# Patient Record
Sex: Male | Born: 1985 | Race: White | Hispanic: No | Marital: Married | Smoking: Current every day smoker
Health system: Southern US, Community
[De-identification: ages and names within clinical notes are randomized; demographics above are authoritative.]

## PROBLEM LIST (undated history)

## (undated) HISTORY — PX: TOOTH EXTRACTION: SUR596

---
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
## 2008-10-07 ENCOUNTER — Emergency Department (HOSPITAL_COMMUNITY): Admission: EM | Admit: 2008-10-07 | Discharge: 2008-10-07 | Payer: Self-pay | Admitting: Emergency Medicine

## 2009-04-07 ENCOUNTER — Emergency Department (HOSPITAL_COMMUNITY): Admission: EM | Admit: 2009-04-07 | Discharge: 2009-04-08 | Payer: Self-pay | Admitting: Emergency Medicine

## 2013-05-26 ENCOUNTER — Emergency Department (HOSPITAL_COMMUNITY)
Admission: EM | Admit: 2013-05-26 | Discharge: 2013-05-26 | Disposition: A | Discharge status: Home / Self Care | Payer: Medicaid Other | Attending: Emergency Medicine | Admitting: Emergency Medicine

## 2013-05-26 ENCOUNTER — Encounter (HOSPITAL_COMMUNITY): Payer: Self-pay | Admitting: *Deleted

## 2013-05-26 ENCOUNTER — Emergency Department (HOSPITAL_COMMUNITY): Payer: Medicaid Other

## 2013-05-26 DIAGNOSIS — Y9239 Other specified sports and athletic area as the place of occurrence of the external cause: Secondary | ICD-10-CM | POA: Insufficient documentation

## 2013-05-26 DIAGNOSIS — X500XXA Overexertion from strenuous movement or load, initial encounter: Secondary | ICD-10-CM | POA: Insufficient documentation

## 2013-05-26 DIAGNOSIS — Y92838 Other recreation area as the place of occurrence of the external cause: Secondary | ICD-10-CM | POA: Insufficient documentation

## 2013-05-26 DIAGNOSIS — F17200 Nicotine dependence, unspecified, uncomplicated: Secondary | ICD-10-CM | POA: Insufficient documentation

## 2013-05-26 DIAGNOSIS — Y9364 Activity, baseball: Secondary | ICD-10-CM | POA: Insufficient documentation

## 2013-05-26 DIAGNOSIS — S8392XA Sprain of unspecified site of left knee, initial encounter: Secondary | ICD-10-CM

## 2013-05-26 DIAGNOSIS — IMO0002 Reserved for concepts with insufficient information to code with codable children: Secondary | ICD-10-CM | POA: Insufficient documentation

## 2013-05-26 MED ORDER — IBUPROFEN 800 MG PO TABS: 800.0000 mg | ORAL_TABLET | Freq: Three times a day (TID) | ORAL | Status: DC

## 2013-05-26 MED ORDER — TRAMADOL HCL 50 MG PO TABS: 50.0000 mg | ORAL_TABLET | Freq: Four times a day (QID) | ORAL | Status: AC | PRN

## 2013-05-26 NOTE — ED Notes (Signed)
Pain rt knee, after sliding into base 1 week ago, Abrasion present.healing.  No swelling. Pt has cont to play baseball since injury.

## 2013-05-26 NOTE — ED Notes (Signed)
Injured right knee sliding into base playing baseball.  Hurts all the way around knee.

## 2013-05-28 NOTE — ED Provider Notes (Signed)
History     CSN: [id number]  Arrival date & time 05/26/13  1035   First MD Initiated Contact with Patient 05/26/13 1125      Chief Complaint  Patient presents with  . Knee Pain    (Consider location/radiation/quality/duration/timing/severity/associated sxs/prior treatment) Patient is a 27 y.o. male presenting with knee pain. The history is provided by the patient and a significant other.  Knee Pain Location:  Knee Time since incident:  1 week Injury: yes   Mechanism of injury: fall   Mechanism of injury comment:  He believes he twisted his knee when sliding into base while playing baseball one week ago.  He has continued to play baseball, but his pain returns with this activity. Fall:    Fall occurred:  Running Knee location:  R knee Pain details:    Quality:  Aching and throbbing   Radiates to:  Does not radiate   Severity:  Moderate   Onset quality:  Sudden   Timing:  Intermittent   Progression:  Unchanged Chronicity:  New Prior injury to area:  No Relieved by:  Rest Worsened by:  Activity Ineffective treatments:  Rest and NSAIDs Associated symptoms: no back pain, no fever, no muscle weakness, no numbness, no swelling and no tingling     History reviewed. No pertinent past medical history.  Past Surgical History  Procedure Laterality Date  . Tooth extraction      History reviewed. No pertinent family history.  History  Substance Use Topics  . Smoking status: Current Every Day Smoker -- 0.50 packs/day    Types: Cigarettes  . Smokeless tobacco: Never Used  . Alcohol Use: No      Review of Systems  Constitutional: Negative for fever.  Musculoskeletal: Positive for joint swelling and arthralgias. Negative for myalgias and back pain.  Neurological: Negative for weakness and numbness.    Allergies  Review of patient's allergies indicates no known allergies.  Home Medications   Current Outpatient Rx  Name  Route  Sig  Dispense  Refill  . ibuprofen  (ADVIL,MOTRIN) 800 MG tablet   Oral   Take 1 tablet (800 mg total) by mouth 3 (three) times daily.   21 tablet   0   . traMADol (ULTRAM) 50 MG tablet   Oral   Take 1 tablet (50 mg total) by mouth every 6 (six) hours as needed for pain.   15 tablet   0     BP 125/66  Pulse 50  Temp(Src) 97.3 F (36.3 C) (Oral)  Resp 17  Ht 6' (1.829 m)  Wt 165 lb (74.844 kg)  BMI 22.37 kg/m2  SpO2 100%  Physical Exam  Constitutional: He appears well-developed and well-nourished.  HENT:  Head: Atraumatic.  Neck: Normal range of motion.  Cardiovascular:  Pulses equal bilaterally  Musculoskeletal: He exhibits tenderness. He exhibits no edema.       Right knee: He exhibits normal range of motion, no swelling, no effusion, no ecchymosis, no deformity, no erythema, no LCL laxity and no MCL laxity. Tenderness found. Lateral joint line tenderness noted.  Neurological: He is alert. He has normal strength. He displays normal reflexes. No sensory deficit.  Equal strength  Skin: Skin is warm and dry.  Psychiatric: He has a normal mood and affect.    ED Course  Procedures (including critical care time)  Labs Reviewed - No data to display No results found.   1. Knee sprain and strain, left, initial encounter  MDM  Patients labs and/or radiological studies were viewed and considered during the medical decision making and disposition process.  X-ray findings were discussed with patient.  He was encouraged to try to minimize activities that exacerbate his pain and to follow up with orthopedics for further evaluation of a suspected soft tissue injury, not limited to either a ligament strain possible meniscal tear.  He was prescribed ibuprofen and tramadol, encouraged RICE, he has a neoprene knee sleeve which he will continue to wear when necessary.  Referral to orthopedics given.  The patient appears reasonably screened and/or stabilized for discharge and I doubt any other medical condition  or other [facility] requiring further screening, evaluation, or treatment in the ED at this time prior to discharge.        Burgess Amor, PA-C 05/28/13 1553

## 2013-05-31 NOTE — ED Provider Notes (Signed)
Medical screening examination/treatment/procedure(s) were performed by non-physician practitioner and as supervising physician I was immediately available for consultation/collaboration. Oluwaferanmi Wain, MD, FACEP   Anique Beckley L Brave Dack, MD 05/31/13 2121 

## 2015-04-15 ENCOUNTER — Encounter (HOSPITAL_COMMUNITY): Payer: Self-pay | Admitting: *Deleted

## 2015-04-15 ENCOUNTER — Emergency Department (HOSPITAL_COMMUNITY): Payer: BLUE CROSS/BLUE SHIELD

## 2015-04-15 ENCOUNTER — Emergency Department (HOSPITAL_COMMUNITY)
Admission: EM | Admit: 2015-04-15 | Discharge: 2015-04-15 | Disposition: A | Discharge status: Home / Self Care | Payer: BLUE CROSS/BLUE SHIELD | Attending: Emergency Medicine | Admitting: Emergency Medicine

## 2015-04-15 DIAGNOSIS — Z720 Tobacco use: Secondary | ICD-10-CM | POA: Diagnosis not present

## 2015-04-15 DIAGNOSIS — S161XXA Strain of muscle, fascia and tendon at neck level, initial encounter: Secondary | ICD-10-CM | POA: Diagnosis not present

## 2015-04-15 DIAGNOSIS — Y9389 Activity, other specified: Secondary | ICD-10-CM | POA: Diagnosis not present

## 2015-04-15 DIAGNOSIS — Y92410 Unspecified street and highway as the place of occurrence of the external cause: Secondary | ICD-10-CM | POA: Insufficient documentation

## 2015-04-15 DIAGNOSIS — Y998 Other external cause status: Secondary | ICD-10-CM | POA: Insufficient documentation

## 2015-04-15 DIAGNOSIS — S199XXA Unspecified injury of neck, initial encounter: Secondary | ICD-10-CM | POA: Diagnosis present

## 2015-04-15 MED ORDER — METHOCARBAMOL 500 MG PO TABS: 1000.0000 mg | ORAL_TABLET | Freq: Three times a day (TID) | ORAL | Status: AC | PRN

## 2015-04-15 MED ORDER — IBUPROFEN 800 MG PO TABS
800.0000 mg | ORAL_TABLET | Freq: Once | ORAL | Status: AC
  Administered 2015-04-15: 800 mg via ORAL
  Filled 2015-04-15: qty 1

## 2015-04-15 MED ORDER — IBUPROFEN 800 MG PO TABS: 800.0000 mg | ORAL_TABLET | Freq: Three times a day (TID) | ORAL | Status: DC

## 2015-04-15 MED ORDER — METHOCARBAMOL 500 MG PO TABS
1000.0000 mg | ORAL_TABLET | Freq: Once | ORAL | Status: AC
  Administered 2015-04-15: 1000 mg via ORAL
  Filled 2015-04-15: qty 2
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
## 2015-04-15 NOTE — ED Notes (Signed)
Gave patient graham crackers and peanut butter as requested and approved by Burgess AmorJulie Idol, PA.
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
## 2015-04-15 NOTE — Discharge Instructions (Signed)
Muscle Strain °A muscle strain is an injury that occurs when a muscle is stretched beyond its normal length. Usually a small number of muscle fibers are torn when this happens. Muscle strain is rated in degrees. First-degree strains have the least amount of muscle fiber tearing and pain. Second-degree and third-degree strains have increasingly more tearing and pain.  °Usually, recovery from muscle strain takes 1-2 weeks. Complete healing takes 5-6 weeks.  °CAUSES  °Muscle strain happens when a sudden, violent force placed on a muscle stretches it too far. This may occur with lifting, sports, or a fall.  °RISK FACTORS °Muscle strain is especially common in athletes.  °SIGNS AND SYMPTOMS °At the site of the muscle strain, there may be: °· Pain. °· Bruising. °· Swelling. °· Difficulty using the muscle due to pain or lack of normal function. °DIAGNOSIS  °Your health care provider will perform a physical exam and ask about your medical history. °TREATMENT  °Often, the best treatment for a muscle strain is resting, icing, and applying cold compresses to the injured area.   °HOME CARE INSTRUCTIONS  °· Use the PRICE method of treatment to promote muscle healing during the first 2-3 days after your injury. The PRICE method involves: °· Protecting the muscle from being injured again. °· Restricting your activity and resting the injured body part. °· Icing your injury. To do this, put ice in a plastic bag. Place a towel between your skin and the bag. Then, apply the ice and leave it on from 15-20 minutes each hour. After the third day, switch to moist heat packs. °· Apply compression to the injured area with a splint or elastic bandage. Be careful not to wrap it too tightly. This may interfere with blood circulation or increase swelling. °· Elevate the injured body part above the level of your heart as often as you can. °· Only take over-the-counter or prescription medicines for pain, discomfort, or fever as directed by your  health care provider. °· Warming up prior to exercise helps to prevent future muscle strains. °SEEK MEDICAL CARE IF:  °· You have increasing pain or swelling in the injured area. °· You have numbness, tingling, or a significant loss of strength in the injured area. °MAKE SURE YOU:  °· Understand these instructions. °· Will watch your condition. °· Will get help right away if you are not doing well or get worse. °Document Released: 12/03/2005 Document Revised: 09/23/2013 Document Reviewed: 07/02/2013 °ExitCare® Patient Information ©2015 [employer]. This information is not intended to replace advice given to you by your health care provider. Make sure you discuss any questions you have with your health care provider. ° °Motor Vehicle Collision °It is common to have multiple bruises and sore muscles after a motor vehicle collision (MVC). These tend to feel worse for the first 24 hours. You may have the most stiffness and soreness over the first several hours. You may also feel worse when you wake up the first morning after your collision. After this point, you will usually begin to improve with each day. The speed of improvement often depends on the severity of the collision, the number of injuries, and the location and nature of these injuries. °HOME CARE INSTRUCTIONS °· Put ice on the injured area. °¨ Put ice in a plastic bag. °¨ Place a towel between your skin and the bag. °¨ Leave the ice on for 15-20 minutes, 3-4 times a day, or as directed by your health care provider. °· Drink enough fluids to   keep your urine clear or pale yellow. Do not drink alcohol. °· Take a warm shower or bath once or twice a day. This will increase blood flow to sore muscles. °· You may return to activities as directed by your caregiver. Be careful when lifting, as this may aggravate neck or back pain. °· Only take over-the-counter or prescription medicines for pain, discomfort, or fever as directed by your caregiver. Do not use  aspirin. This may increase bruising and bleeding. °SEEK IMMEDIATE MEDICAL CARE IF: °· You have numbness, tingling, or weakness in the arms or legs. °· You develop severe headaches not relieved with medicine. °· You have severe neck pain, especially tenderness in the middle of the back of your neck. °· You have changes in bowel or bladder control. °· There is increasing pain in any area of the body. °· You have shortness of breath, light-headedness, dizziness, or fainting. °· You have chest pain. °· You feel sick to your stomach (nauseous), throw up (vomit), or sweat. °· You have increasing abdominal discomfort. °· There is blood in your urine, stool, or vomit. °· You have pain in your shoulder (shoulder strap areas). °· You feel your symptoms are getting worse. °MAKE SURE YOU: °· Understand these instructions. °· Will watch your condition. °· Will get help right away if you are not doing well or get worse. °Document Released: 12/03/2005 Document Revised: 04/19/2014 Document Reviewed: 05/02/2011 °ExitCare® Patient Information ©2015 [employer]. This information is not intended to replace advice given to you by your health care provider. Make sure you discuss any questions you have with your health care provider. ° °

## 2015-08-10 ENCOUNTER — Ambulatory Visit (INDEPENDENT_AMBULATORY_CARE_PROVIDER_SITE_OTHER): Payer: 59 | Admitting: Family Medicine

## 2015-08-10 ENCOUNTER — Encounter: Payer: Self-pay | Admitting: Family Medicine

## 2015-08-10 VITALS — BP 130/76 | Wt 178.4 lb

## 2015-08-10 DIAGNOSIS — M25561 Pain in right knee: Secondary | ICD-10-CM | POA: Diagnosis not present

## 2015-08-10 MED ORDER — NABUMETONE 750 MG PO TABS: 750.0000 mg | ORAL_TABLET | Freq: Two times a day (BID) | ORAL | Status: AC

## 2015-08-10 NOTE — Progress Notes (Signed)
   Subjective:    Patient ID: Logan Nguyen, male    DOB: [birth year], 29 y.o.   MRN: [id number]  Knee Pain  The incident occurred more than 1 week ago. The injury mechanism was a twisting injury (Patient states that he stepped in a whole at work and twisted knee 8 year ago). The pain is present in the right knee. He reports no foreign bodies present. Exacerbated by: Bending. He has tried NSAIDs (Ibuprofen) for the symptoms. The treatment provided mild relief.    Patient states that knee pain originally started 8 years ago after stepping in a hole and twisting knee at work. Patient states that pain has worsened over the past few months causing him to wake up in the middle of the night. Patient states no other concerns this visit.  At times right knee was suddenly giveaway. Will swell at times. Painful with any type of motion. Unable to participate in sports anymore. Starting to affect his ability to do the work he needs to do. Pain severe times. Now starting to wake him up in the middle of the night  ibu 200 three or four as needed   Does a lot of heavy lifting, drives a fork truck, has to pick things up, hurts with eithr motion +++++++++++++++++++++++++++++++++++++ Review of Systems No headache no chest pain no back pain no abdominal pain    Objective:   Physical Exam  Alert vital stable HEENT normal. Lungs clear heart regular rate and rhythm. Right leg negative straight leg raise right knee positive crepitations positive medial joint line tenderness some swelling evident      Assessment & Plan:  Impression chronic right knee difficulties, started with a sudden event. Progressive in nature now affecting patient considerably both at work and home. Already is had x-ray. Plan MRI affected knee. An inflammatory medicine prescribed. Likely will need or so referral once we know results. WSL

## 2015-08-23 ENCOUNTER — Other Ambulatory Visit (HOSPITAL_COMMUNITY): Payer: BLUE CROSS/BLUE SHIELD

## 2015-08-24 ENCOUNTER — Ambulatory Visit (HOSPITAL_COMMUNITY): Payer: 59

## 2015-09-01 ENCOUNTER — Ambulatory Visit (HOSPITAL_COMMUNITY): Admission: RE | Admit: 2015-09-01 | Payer: 59 | Source: Ambulatory Visit

## 2015-12-31 ENCOUNTER — Emergency Department (HOSPITAL_COMMUNITY)
Admission: EM | Admit: 2015-12-31 | Discharge: 2015-12-31 | Disposition: A | Discharge status: Home / Self Care | Payer: Self-pay | Attending: Emergency Medicine | Admitting: Emergency Medicine

## 2015-12-31 ENCOUNTER — Encounter (HOSPITAL_COMMUNITY): Payer: Self-pay | Admitting: *Deleted

## 2015-12-31 DIAGNOSIS — Y9389 Activity, other specified: Secondary | ICD-10-CM | POA: Insufficient documentation

## 2015-12-31 DIAGNOSIS — Y9289 Other specified places as the place of occurrence of the external cause: Secondary | ICD-10-CM | POA: Insufficient documentation

## 2015-12-31 DIAGNOSIS — S91134A Puncture wound without foreign body of right lesser toe(s) without damage to nail, initial encounter: Secondary | ICD-10-CM | POA: Insufficient documentation

## 2015-12-31 DIAGNOSIS — Y990 Civilian activity done for income or pay: Secondary | ICD-10-CM | POA: Insufficient documentation

## 2015-12-31 DIAGNOSIS — Z791 Long term (current) use of non-steroidal anti-inflammatories (NSAID): Secondary | ICD-10-CM | POA: Insufficient documentation

## 2015-12-31 DIAGNOSIS — W450XXA Nail entering through skin, initial encounter: Secondary | ICD-10-CM | POA: Insufficient documentation

## 2015-12-31 DIAGNOSIS — Z23 Encounter for immunization: Secondary | ICD-10-CM | POA: Insufficient documentation

## 2015-12-31 DIAGNOSIS — S91331A Puncture wound without foreign body, right foot, initial encounter: Secondary | ICD-10-CM

## 2015-12-31 DIAGNOSIS — F17210 Nicotine dependence, cigarettes, uncomplicated: Secondary | ICD-10-CM | POA: Insufficient documentation

## 2015-12-31 MED ORDER — CIPROFLOXACIN HCL 500 MG PO TABS: 500.0000 mg | ORAL_TABLET | Freq: Two times a day (BID) | ORAL | Status: AC

## 2015-12-31 MED ORDER — TETANUS-DIPHTH-ACELL PERTUSSIS 5-2.5-18.5 LF-MCG/0.5 IM SUSP
0.5000 mL | Freq: Once | INTRAMUSCULAR | Status: AC
  Administered 2015-12-31: 0.5 mL via INTRAMUSCULAR
  Filled 2015-12-31: qty 0.5

## 2015-12-31 MED ORDER — CIPROFLOXACIN HCL 250 MG PO TABS
500.0000 mg | ORAL_TABLET | Freq: Once | ORAL | Status: AC
  Administered 2015-12-31: 500 mg via ORAL
  Filled 2015-12-31: qty 2

## 2015-12-31 MED ORDER — IBUPROFEN 800 MG PO TABS
800.0000 mg | ORAL_TABLET | Freq: Once | ORAL | Status: AC
  Administered 2015-12-31: 800 mg via ORAL
  Filled 2015-12-31: qty 1

## 2015-12-31 MED ORDER — IBUPROFEN 800 MG PO TABS
ORAL_TABLET | ORAL | Status: AC
  Filled 2015-12-31: qty 1

## 2015-12-31 MED ORDER — MELOXICAM 15 MG PO TABS: 15.0000 mg | ORAL_TABLET | Freq: Every day | ORAL | Status: AC

## 2015-12-31 NOTE — ED Notes (Signed)
Pt states he stepped on a nail Friday at work. Pt c/o pain to right foot.

## 2015-12-31 NOTE — Discharge Instructions (Signed)
Please soak your foot in warm salt water for 10-15 minutes daily until the wound is healed. His use Cipro 2 times daily, Mobic daily with food. Please see your primary physician if any red streaks, pus like drainage, or signs of advancing infection. Puncture Wound A puncture wound is an injury that is caused by a sharp, thin object that goes through (penetrates) your skin. Usually, a puncture wound does not leave a large opening in your skin, so it may not bleed a lot. However, when you get a puncture wound, dirt or other materials (foreign bodies) can be forced into your wound and break off inside. This increases the chance of infection, such as tetanus. CAUSES Puncture wounds are caused by any sharp, thin object that goes through your skin, such as:  Animal teeth, as with an animal bite.  Sharp, pointed objects, such as nails, splinters of glass, fishhooks, and needles. SYMPTOMS Symptoms of a puncture wound include:  Pain.  Bleeding.  Swelling.  Bruising.  Fluid leaking from the wound.  Numbness, tingling, or loss of function. DIAGNOSIS This condition is diagnosed with a medical history and physical exam. Your wound will be checked to see if it contains any foreign bodies. You may also have X-rays or other imaging tests. TREATMENT Treatment for a puncture wound depends on how serious the wound is. It also depends on whether the wound contains any foreign bodies. Treatment for all types of puncture wounds usually starts with:  Controlling the bleeding.  Washing out the wound with a germ-free (sterile) salt-water solution.  Checking the wound for foreign bodies. Treatment may also include:  Having the wound opened surgically to remove a foreign object.  Closing the wound with stitches (sutures) if it continues to bleed.  Covering the wound with antibiotic ointments and a bandage (dressing).  Receiving a tetanus shot.  Receiving a rabies vaccine. HOME CARE  INSTRUCTIONS Medicines  Take or apply over-the-counter and prescription medicines only as told by your health care provider.  If you were prescribed an antibiotic, take or apply it as told by your health care provider. Do not stop using the antibiotic even if your condition improves. Wound Care  There are many ways to close and cover a wound. For example, a wound can be covered with sutures, skin glue, or adhesive strips. Follow instructions from your health care provider about:  How to take care of your wound.  When and how you should change your dressing.  When you should remove your dressing.  Removing whatever was used to close your wound.  Keep the dressing dry as told by your health care provider. Do not take baths, swim, use a hot tub, or do anything that would put your wound underwater until your health care provider approves.  Clean the wound as told by your health care provider.  Do not scratch or pick at the wound.  Check your wound every day for signs of infection. Watch for:  Redness, swelling, or pain.  Fluid, blood, or pus. General Instructions  Raise (elevate) the injured area above the level of your heart while you are sitting or lying down.  If your puncture wound is in your foot, ask your health care provider if you need to avoid putting weight on your foot and for how long.  Keep all follow-up visits as told by your health care provider. This is important. SEEK MEDICAL CARE IF:  You received a tetanus shot and you have swelling, severe pain, redness, or  bleeding at the injection site.  You have a fever.  Your sutures come out.  You notice a bad smell coming from your wound or your dressing.  You notice something coming out of your wound, such as wood or glass.  Your pain is not controlled with medicine.  You have increased redness, swelling, or pain at the site of your wound.  You have fluid, blood, or pus coming from your wound.  You notice  a change in the color of your skin near your wound.  You need to change the dressing frequently due to fluid, blood, or pus draining from your wound.  You develop a new rash.  You develop numbness around your wound. SEEK IMMEDIATE MEDICAL CARE IF:  You develop severe swelling around your wound.  Your pain suddenly increases and is severe.  You develop painful skin lumps.  You have a red streak going away from your wound.  The wound is on your hand or foot and you cannot properly move a finger or toe.  The wound is on your hand or foot and you notice that your fingers or toes look pale or bluish.   This information is not intended to replace advice given to you by your health care provider. Make sure you discuss any questions you have with your health care provider.   Document Released: 09/12/2005 Document Revised: 08/24/2015 Document Reviewed: 01/26/2015 [employer] Interactive Patient Education [employer]2016 [employer]

## 2015-12-31 NOTE — ED Provider Notes (Signed)
CSN: [id number]     Arrival date & time 12/31/15  2217 History  By signing my name below, I, Placido Sou, attest that this documentation has been prepared under the direction and in the presence of Ivery Quale, PA-C. Electronically Signed: Placido Sou, ED Scribe. 12/31/2015. 11:07 PM.   Chief Complaint  Patient presents with  . Foot Injury   Patient is a 30 y.o. male presenting with foot injury. The history is provided by the patient. No language interpreter was used.  Foot Injury Location:  Foot Time since incident:  1 day Injury: no   Foot location:  Sole of R foot  HPI Comments: YSABEL COWGILL is a 30 y.o. male who presents to the Emergency Department complaining of constant, mild, right foot sole pain after stepping on a nail yesterday morning while wearing socks and rubber bottomed boots. He notes his pain worsens when standing or ambulating and associated, mild pain to his right fourth toe. He denies any known medical conditions or being on any regular medications. Pt is unsure of the timing of his last TDAP booster. He denies any other associated symptoms at this time.    History reviewed. No pertinent past medical history. Past Surgical History  Procedure Laterality Date  . Tooth extraction     History reviewed. No pertinent family history. Social History  Substance Use Topics  . Smoking status: Current Every Day Smoker -- 0.50 packs/day    Types: Cigarettes  . Smokeless tobacco: Never Used  . Alcohol Use: No    Review of Systems  Musculoskeletal: Positive for myalgias.  Skin: Positive for wound.  All other systems reviewed and are negative.   Allergies  Review of patient's allergies indicates no known allergies.  Home Medications   Prior to Admission medications   Medication Sig Start Date End Date Taking? Authorizing Provider  ibuprofen (ADVIL,MOTRIN) 800 MG tablet Take 1 tablet (800 mg total) by mouth 3 (three) times daily. 04/15/15   Burgess Amor,  PA-C  methocarbamol (ROBAXIN) 500 MG tablet Take 2 tablets (1,000 mg total) by mouth every 8 (eight) hours as needed for muscle spasms. Patient not taking: Reported on 08/10/2015 04/15/15   Burgess Amor, PA-C  nabumetone (RELAFEN) 750 MG tablet Take 1 tablet (750 mg total) by mouth 2 (two) times daily. With food 08/10/15   Merlyn Albert, MD  traMADol (ULTRAM) 50 MG tablet Take 1 tablet (50 mg total) by mouth every 6 (six) hours as needed for pain. Patient not taking: Reported on 04/15/2015 05/26/13   Burgess Amor, PA-C   BP 132/69 mmHg  Pulse 56  Temp(Src) 98.6 F (37 C) (Oral)  Resp 16  Ht 6' (1.829 m)  Wt 175 lb (79.379 kg)  BMI 23.73 kg/m2  SpO2 99%    Physical Exam  Constitutional: He is oriented to person, place, and time. He appears well-developed and well-nourished.  HENT:  Head: Normocephalic and atraumatic.  Neck: Normal range of motion.  Cardiovascular: Normal rate, regular rhythm and normal heart sounds.  Exam reveals no gallop and no friction rub.   No murmur heard. Pulses:      Dorsalis pedis pulses are 2+ on the right side, and 2+ on the left side.       Posterior tibial pulses are 2+ on the right side, and 2+ on the left side.  Cap refill less than 2 seconds  Pulmonary/Chest: Effort normal and breath sounds normal. No respiratory distress. He has no wheezes. He has no rales.  He exhibits no tenderness.  Abdominal: Soft.  Musculoskeletal: Normal range of motion.  Right foot: No lesions between toes; small puncture wound near the metatarsal head of fourth phalanx; mild TTP at puncture site; no read streaks or drainage; no tendon involvement; no other puncture wounds appreciated  Neurological: He is alert and oriented to person, place, and time.  Skin: Skin is warm and dry. He is not diaphoretic.  Psychiatric: He has a normal mood and affect. His behavior is normal.  Nursing note and vitals reviewed.   ED Course  Procedures  DIAGNOSTIC STUDIES: Oxygen Saturation is 99%  on RA, normal by my interpretation.    COORDINATION OF CARE: 10:58 PM Pt presents today due to pain in his right foot. Discussed next steps with pt including TDAP, Cipro and warm soaks. Pt agreed to the plan.    Labs Review Labs Reviewed - No data to display  Imaging Review No results found.   EKG Interpretation None      MDM Pt was wearing a rubber sole shoe when he stepped on a nail. No neurovascular changes noted. Pt covered with tetanus, and cipro daily. Pt given instruction to return if any signs of advancing infection.   Final diagnoses:  None    *I have reviewed nursing notes, vital signs, and all appropriate lab and imaging results for this patient.**  I personally performed the services described in this documentation, which was scribed in my presence. The recorded information has been reviewed and is accurate.   Ivery QualeHobson Earma Nicolaou, PA-C 01/01/16 1711  Samuel JesterKathleen McManus, DO 01/04/16 [phone]

## 2016-12-26 ENCOUNTER — Encounter (HOSPITAL_COMMUNITY): Payer: Self-pay

## 2016-12-26 ENCOUNTER — Emergency Department (HOSPITAL_COMMUNITY)
Admission: EM | Admit: 2016-12-26 | Discharge: 2016-12-26 | Disposition: A | Discharge status: Home / Self Care | Payer: Self-pay | Attending: Emergency Medicine | Admitting: Emergency Medicine

## 2016-12-26 ENCOUNTER — Emergency Department (HOSPITAL_COMMUNITY): Payer: Self-pay

## 2016-12-26 DIAGNOSIS — X58XXXA Exposure to other specified factors, initial encounter: Secondary | ICD-10-CM | POA: Insufficient documentation

## 2016-12-26 DIAGNOSIS — Y990 Civilian activity done for income or pay: Secondary | ICD-10-CM | POA: Insufficient documentation

## 2016-12-26 DIAGNOSIS — F17210 Nicotine dependence, cigarettes, uncomplicated: Secondary | ICD-10-CM | POA: Insufficient documentation

## 2016-12-26 DIAGNOSIS — Y929 Unspecified place or not applicable: Secondary | ICD-10-CM | POA: Insufficient documentation

## 2016-12-26 DIAGNOSIS — Y939 Activity, unspecified: Secondary | ICD-10-CM | POA: Insufficient documentation

## 2016-12-26 DIAGNOSIS — M25561 Pain in right knee: Secondary | ICD-10-CM | POA: Insufficient documentation

## 2016-12-26 MED ORDER — IBUPROFEN 800 MG PO TABS: 800.0000 mg | ORAL_TABLET | Freq: Three times a day (TID) | ORAL | 0 refills | Status: AC | PRN

## 2016-12-26 NOTE — ED Triage Notes (Signed)
Pt states that his R knee has been hurting him for years, about two years ago he was supposed to have an MRI and he didn't, for the two days he has been unable to put pressure on it. Denies injury. Pain unrelieved by PTC medications.

## 2016-12-26 NOTE — ED Provider Notes (Signed)
[facility]-EMERGENCY DEPT Provider Note   CSN: [id number] Arrival date & time: 12/26/16  1941  By signing my name below, I, Logan Nguyen, attest that this documentation has been prepared under the direction and in the presence of [facility], PA-C. Electronically Signed: Orpah Nguyen , ED Scribe. 12/26/16. 10:08 PM.   History   Chief Complaint Chief Complaint  Patient presents with  . Knee Pain    HPI  Logan Nguyen is a 31 y.o. male with  who presents to the Emergency Department complaining of an acute worsening of chronic right knee pain which began yesterday. 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 This morning, pt states that he was unable to put pressure on the R leg due to the pain. Pain improved throughout the day and he has been able to ambulate without difficulty for the majority of the day. He states that the R knee feels as if something is pulling behind the knee as well as pain to the lateral aspect. Pt has used compression brace and Ibuprofen with no relief. He experiences intermittent pain to the right knee ever since an injury playing football in highschool > 10 years ago. Two years ago he had a flare similar where he was told he needed an MRI, however he did not follow up to get imaging performed. No numbness, tingling or muscle weakness. No fevers or redness.   The history is provided by the patient. No language interpreter was used.    History reviewed. No pertinent past medical history.  There are no active problems to display for this patient.   Past Surgical History:  Procedure Laterality Date  . TOOTH EXTRACTION         Home Medications    Prior to Admission medications   Medication Sig Start Date End Date Taking? Authorizing Provider  ciprofloxacin (CIPRO) 500 MG tablet Take 1 tablet (500 mg total) by mouth 2 (two) times daily. 12/31/15   Ivery Quale, PA-C    ibuprofen (ADVIL,MOTRIN) 800 MG tablet Take 1 tablet (800 mg total) by mouth every 8 (eight) hours as needed. 12/26/16   Jaime Pilcher Ward, PA-C  meloxicam (MOBIC) 15 MG tablet Take 1 tablet (15 mg total) by mouth daily. 12/31/15   Ivery Quale, PA-C  methocarbamol (ROBAXIN) 500 MG tablet Take 2 tablets (1,000 mg total) by mouth every 8 (eight) hours as needed for muscle spasms. Patient not taking: Reported on 08/10/2015 04/15/15   Burgess Amor, PA-C  nabumetone (RELAFEN) 750 MG tablet Take 1 tablet (750 mg total) by mouth 2 (two) times daily. With food Patient taking differently: Take 750 mg by mouth 2 (two) times daily as needed. With food 08/10/15   Merlyn Albert, MD  traMADol (ULTRAM) 50 MG tablet Take 1 tablet (50 mg total) by mouth every 6 (six) hours as needed for pain. Patient not taking: Reported on 04/15/2015 05/26/13   Burgess Amor, PA-C    Family History No family history on file.  Social History Social History  Substance Use Topics  . Smoking status: Current Every Day Smoker    Packs/day: 0.50    Types: Cigarettes  . Smokeless tobacco: Never Used  . Alcohol use No     Allergies   Patient has no known allergies.   Review of Systems Review of Systems  Constitutional: Negative for fever.  Musculoskeletal: Positive for arthralgias (R knee).  Neurological: Negative  for weakness and numbness.     Physical Exam Updated Vital Signs BP 128/65 (BP Location: Left Arm)   Pulse (!) 56   Temp 98.1 F (36.7 C) (Oral)   Resp 18   Ht 6' (1.829 m)   Wt 87.1 kg   SpO2 98%   BMI 26.04 kg/m   Physical Exam  Constitutional: He is oriented to person, place, and time. He appears well-developed and well-nourished. No distress.  HENT:  Head: Normocephalic and atraumatic.  Cardiovascular: Normal rate, regular rhythm and normal heart sounds.   No murmur heard. Pulmonary/Chest: Effort normal and breath sounds normal. No respiratory distress.  Musculoskeletal: He exhibits no edema.   Right knee: No gross deformity noted. Knee with full ROM. + lateral joint line tenderness; negative McMurray's. There is no joint effusion or swelling appreciated. No abnormal alignment or patellar mobility. No bruising, erythema, or warmth overlaying the joint. No varus/valgus laxity. Negative drawer's, negative Lachman's, no crepitus. 2+ DP pulses bilaterally. All compartments are soft. Sensation intact distal to injury.  Neurological: He is alert and oriented to person, place, and time.  Skin: Skin is warm and dry.  Nursing note and vitals reviewed.    ED Treatments / Results   DIAGNOSTIC STUDIES: Oxygen Saturation is 100% on RA, normal by my interpretation.   COORDINATION OF CARE: 10:08 PM-Discussed next steps with pt. Pt verbalized understanding and is agreeable with the plan.    Labs (all labs ordered are listed, but only abnormal results are displayed) Labs Reviewed - No data to display  EKG  EKG Interpretation None       Radiology Dg Knee Complete 4 Views Right  Result Date: 12/26/2016 CLINICAL DATA:  Posterior right knee pain worsening over 1 week EXAM: RIGHT KNEE - COMPLETE 4+ VIEW COMPARISON:  05/26/2013 FINDINGS: No evidence of fracture, dislocation, or joint effusion. No evidence of arthropathy or other focal bone abnormality. Soft tissues are unremarkable. IMPRESSION: Negative. Electronically Signed   By: Jasmine PangKim  Fujinaga M.D.   On: 12/26/2016 20:39    Procedures Procedures (including critical care time)  Medications Ordered in ED Medications - No data to display   Initial Impression / Assessment and Plan / ED Course  I have reviewed the triage vital signs and the nursing notes.  Pertinent labs & imaging results that were available during my care of the patient were reviewed by me and considered in my medical decision making (see chart for details).  Clinical Course    Logan Nguyen is a 31 y.o. 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 On exam, patient is able to ambulate with a steady gait. Ligaments appear intact. He does have lateral joint line tenderness - negative McMurray's. X-ray obtained and negative. He has a knee sleeve already. Offered crutches but patient declines. Symptomatic home care instructions discussed. Ortho follow up if no improvement. Follow up care and return precautions discussed. All questions answered.    Final Clinical Impressions(s) / ED Diagnoses   Final diagnoses:  Right knee pain, unspecified chronicity    New Prescriptions New Prescriptions   IBUPROFEN (ADVIL,MOTRIN) 800 MG TABLET    Take 1 tablet (800 mg total) by mouth every 8 (eight) hours as needed.   I personally performed the services described in this documentation, which was scribed in my presence. The recorded information has been reviewed and is accurate.  [facility] Ward, PA-C 12/26/16 2209    Derwood Kaplan, MD 12/27/16 [phone]

## 2016-12-26 NOTE — Discharge Instructions (Signed)
It was my pleasure taking care of you today!   Ice and elevate knee throughout the day. Ibuprofen for swelling and pain. Call orthopedist listed tomorrow morning to schedule follow up appointment for recheck of ongoing knee pain in one week. That appointment can be canceled with a 24-48 hour notice if complete resolution of pain.  COLD THERAPY DIRECTIONS:  Ice or gel packs can be used to reduce both pain and swelling. Ice is the most helpful within the first 24 to 48 hours after an injury or flareup from overusing a muscle or joint.  Ice is effective, has very few side effects, and is safe for most people to use.   If you expose your skin to cold temperatures for too long or without the proper protection, you can damage your skin or nerves. Watch for signs of skin damage due to cold.   HOME CARE INSTRUCTIONS  Follow these tips to use ice and cold packs safely.  Place a dry or damp towel between the ice and skin. A damp towel will cool the skin more quickly, so you may need to shorten the time that the ice is used.  For a more rapid response, add gentle compression to the ice.  Ice for no more than 10 to 20 minutes at a time. The bonier the area you are icing, the less time it will take to get the benefits of ice.  Check your skin after 5 minutes to make sure there are no signs of a poor response to cold or skin damage.  Rest 20 minutes or more in between uses.  Once your skin is numb, you can end your treatment. You can test numbness by very lightly touching your skin. The touch should be so light that you do not see the skin dimple from the pressure of your fingertip. When using ice, most people will feel these normal sensations in this order: cold, burning, aching, and numbness.

## 2018-09-01 IMAGING — DX DG KNEE COMPLETE 4+V*R*
4 series · 4 of 4 positions shown · non-contrast
Comparison: 05/26/2013

CLINICAL DATA: Posterior right knee pain worsening over 1 week

EXAM:
RIGHT KNEE - COMPLETE 4+ VIEW

[t knee ap right]
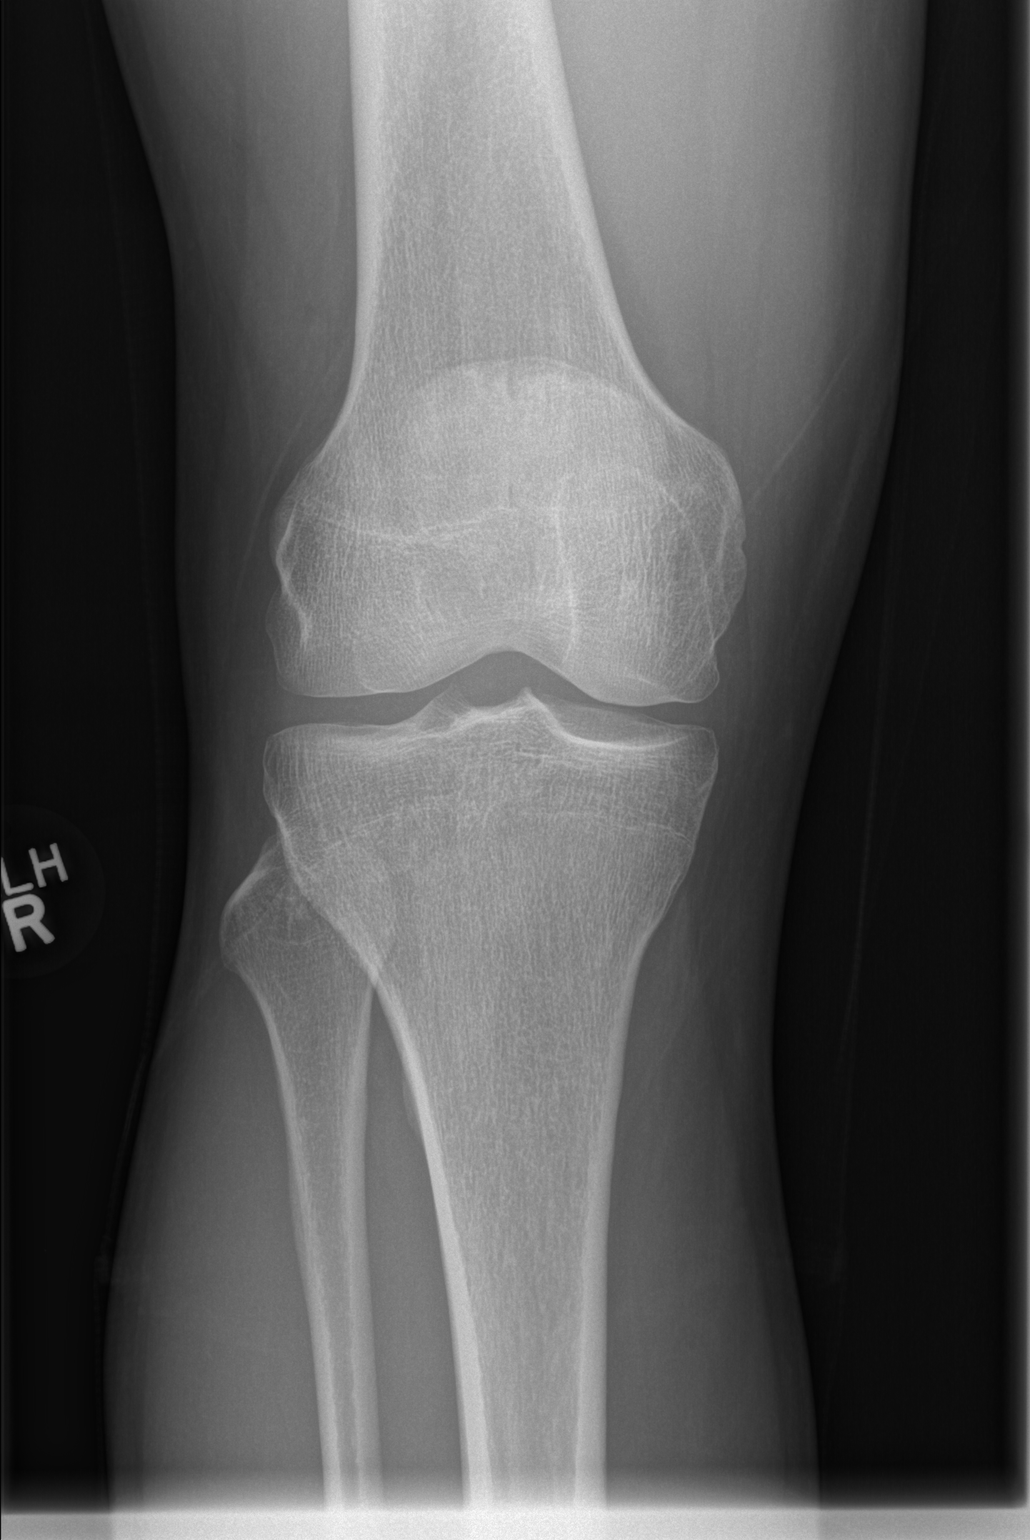

[t knee obl right (1 of 2)]
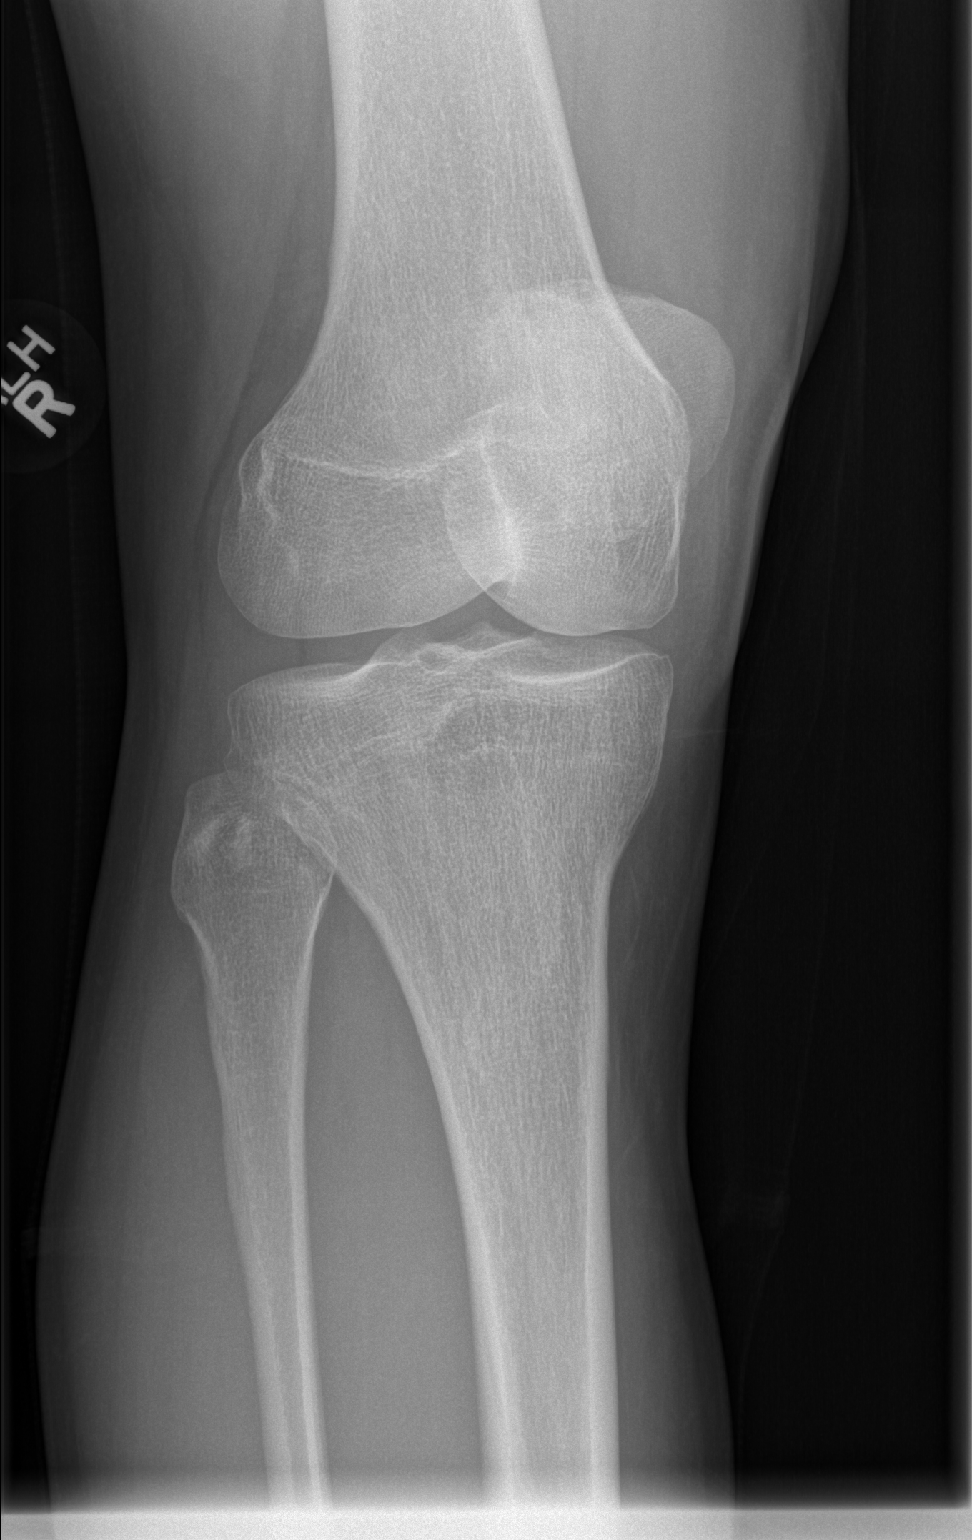

[t knee obl right (2 of 2)]
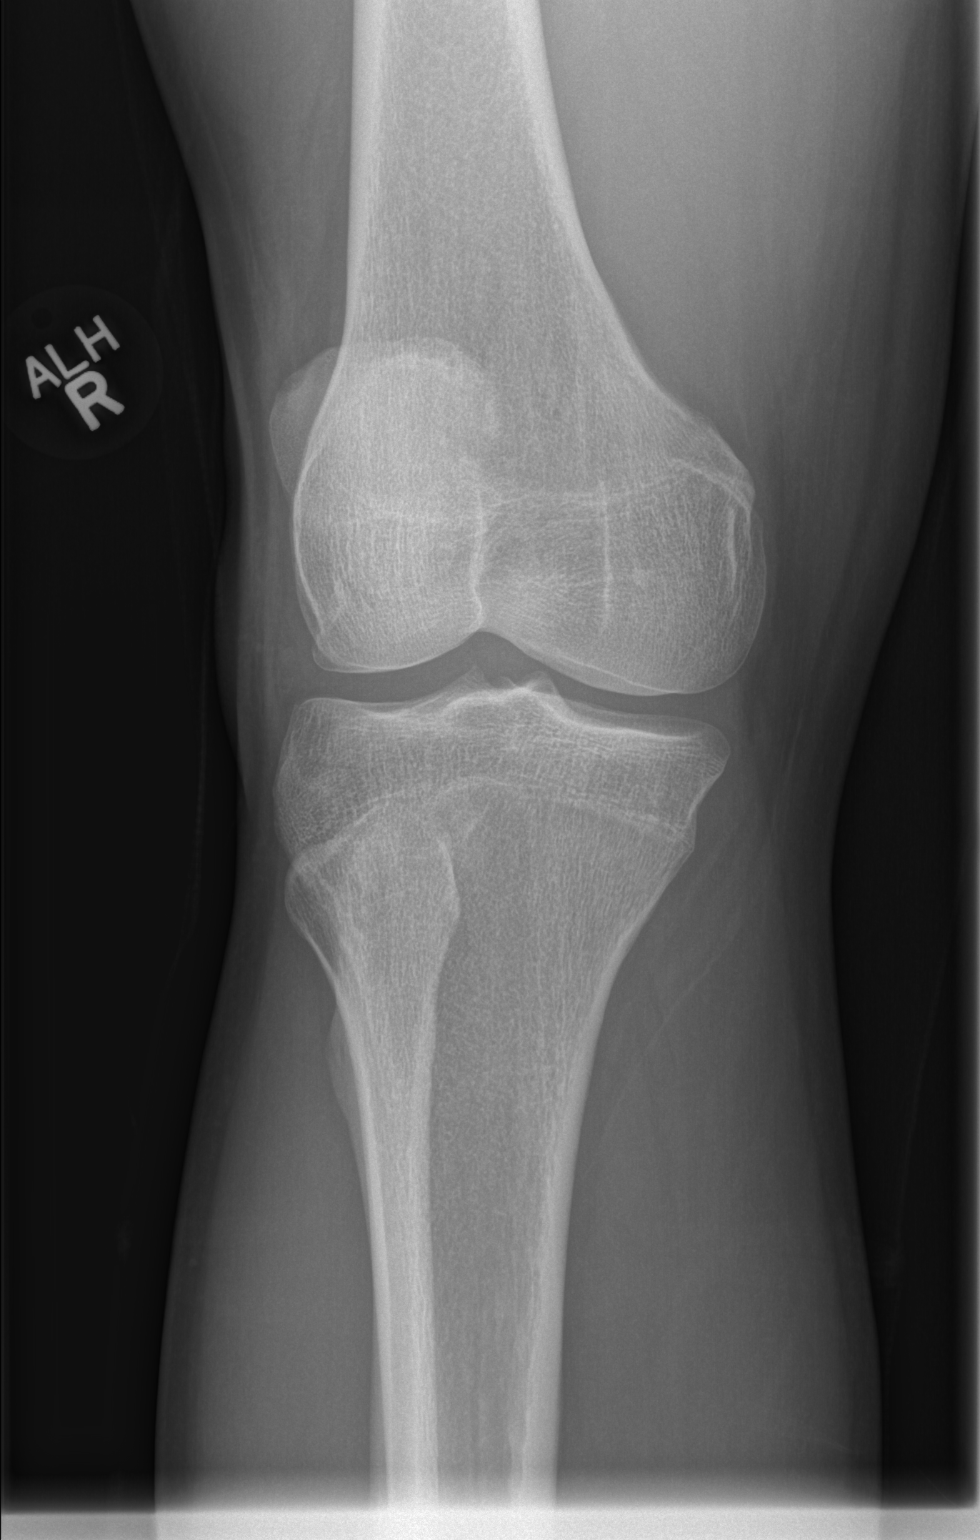

[t knee lat right]
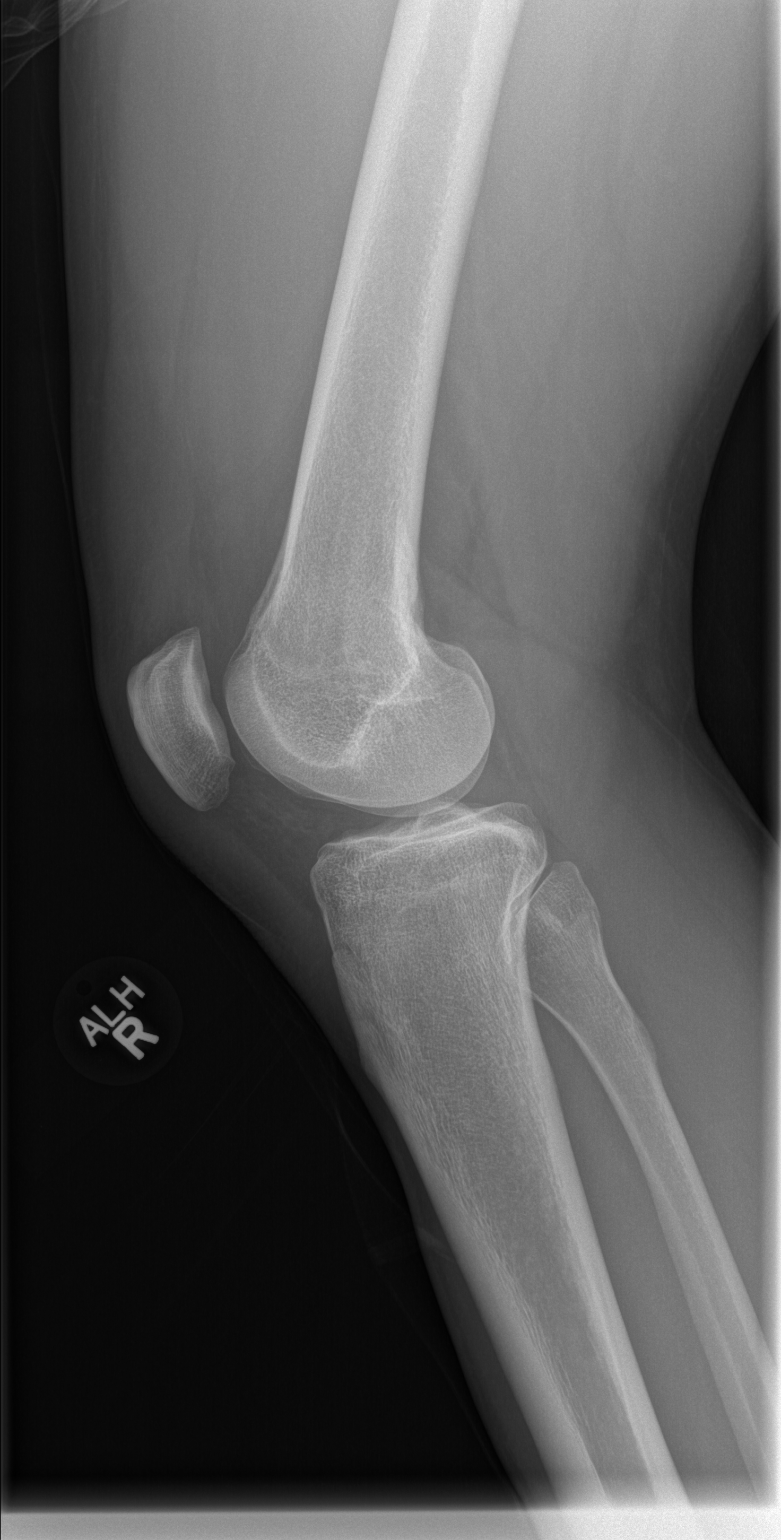

[4 of 4 positions shown; findings below may reference images not displayed]

FINDINGS: No evidence of fracture, dislocation, or joint effusion. No evidence
of arthropathy or other focal bone abnormality. Soft tissues are
unremarkable.
IMPRESSION: Negative.

## 2020-01-18 ENCOUNTER — Encounter: Payer: Self-pay | Admitting: Family Medicine
